# Patient Record
Sex: Male | Born: 1975 | Race: Black or African American | Hispanic: No | Marital: Married | Smoking: Never smoker
Health system: Southern US, Community
[De-identification: ages and names within clinical notes are randomized; demographics above are authoritative.]

## PROBLEM LIST (undated history)

## (undated) HISTORY — PX: HERNIA REPAIR: SHX51

---

## 1999-03-03 ENCOUNTER — Emergency Department (HOSPITAL_COMMUNITY): Admission: EM | Admit: 1999-03-03 | Discharge: 1999-03-03 | Payer: Self-pay | Admitting: Emergency Medicine

## 2014-04-02 ENCOUNTER — Encounter (HOSPITAL_COMMUNITY): Payer: Self-pay | Admitting: Emergency Medicine

## 2014-04-02 ENCOUNTER — Emergency Department (HOSPITAL_COMMUNITY): Payer: Worker's Compensation

## 2014-04-02 ENCOUNTER — Emergency Department (HOSPITAL_COMMUNITY)
Admission: EM | Admit: 2014-04-02 | Discharge: 2014-04-02 | Disposition: A | Discharge status: Home / Self Care | Payer: Worker's Compensation | Attending: Emergency Medicine | Admitting: Emergency Medicine

## 2014-04-02 DIAGNOSIS — Y9389 Activity, other specified: Secondary | ICD-10-CM | POA: Insufficient documentation

## 2014-04-02 DIAGNOSIS — Z880 Allergy status to penicillin: Secondary | ICD-10-CM | POA: Insufficient documentation

## 2014-04-02 DIAGNOSIS — S61409A Unspecified open wound of unspecified hand, initial encounter: Secondary | ICD-10-CM | POA: Diagnosis not present

## 2014-04-02 DIAGNOSIS — Y9289 Other specified places as the place of occurrence of the external cause: Secondary | ICD-10-CM | POA: Diagnosis not present

## 2014-04-02 DIAGNOSIS — W268XXA Contact with other sharp object(s), not elsewhere classified, initial encounter: Secondary | ICD-10-CM | POA: Diagnosis not present

## 2014-04-02 DIAGNOSIS — S61412A Laceration without foreign body of left hand, initial encounter: Secondary | ICD-10-CM

## 2014-04-02 MED ORDER — HYDROCODONE-ACETAMINOPHEN 5-325 MG PO TABS: 1.0000 | ORAL_TABLET | Freq: Four times a day (QID) | ORAL | Status: AC | PRN

## 2014-04-02 NOTE — ED Notes (Signed)
Pt was at work.  Polishing wine glass.  Stem broke lacerating left palm.

## 2014-04-02 NOTE — ED Provider Notes (Signed)
CSN: [id number]     Arrival date & time 04/02/14  1827 History  This chart was scribed for non-physician practitioner working with Audree CamelScott T Goldston, MD by Elveria Risingimelie Horne, ED Scribe. This patient was seen in room WTR5/WTR5 and the patient's care was started at 7:18 PM.   Chief Complaint  Patient presents with  . Extremity Laceration     The history is provided by the patient. No language interpreter was used.   HPI Comments: Collin Keller is a 38 y.o. male who presents to the Emergency Department with left palm laceration incurred tonight at work, approximately 1.5 hours ago. Patient reports that while polishing a wine glass, the stem snapped cutting his palm. Patient bandaged the laceration. Bleeding controlled prior to examination.  Patient reports updated Tetanus vaccination.  Patient is right hand dominant.    History reviewed. No pertinent past medical history. Past Surgical History  Procedure Laterality Date  . Hernia repair     History reviewed. No pertinent family history. History  Substance Use Topics  . Smoking status: Never Smoker   . Smokeless tobacco: Not on file  . Alcohol Use: No    Review of Systems  Constitutional: Negative for fever and chills.  Skin:       Laceration      Allergies  Amoxicillin and Penicillins  Home Medications   Prior to Admission medications   Not on File   Triage Vitals: BP 138/88  Pulse 99  Temp(Src) 98.5 F (36.9 C) (Oral)  Resp 18  SpO2 98% Physical Exam  Nursing note and vitals reviewed. Constitutional: He is oriented to person, place, and time. He appears well-developed and well-nourished. No distress.  HENT:  Head: Normocephalic and atraumatic.  Eyes: EOM are normal.  Neck: Neck supple.  Cardiovascular: Normal rate.   Pulmonary/Chest: Effort normal.  Musculoskeletal: Normal range of motion. He exhibits tenderness.  Left hand: 1cm laceration to palmar aspect at hypothenar  with no foreign object.  Mild  tenderness. Not actively bleeding.   Neurological: He is alert and oriented to person, place, and time.  Skin: Skin is warm and dry.  Psychiatric: He has a normal mood and affect. His behavior is normal.    ED Course  Procedures (including critical care time)  LACERATION REPAIR Performed by: Fayrene HelperBowie Crescent Gotham, PA-C Consent: Verbal consent obtained. Risks and benefits: risks, benefits and alternatives were discussed Patient identity confirmed: provided demographic data Time out performed prior to procedure Prepped and Draped in normal sterile fashion Wound explored Laceration Location: left hypothenar  Laceration Length: superficial 1 cm laceration, no foreign bodies  No Foreign Bodies seen or palpated Anesthesia: local infiltration Local anesthetic: lidocaine 2% with epinephrine Anesthetic total: 2 ml Irrigation method: syringe Amount of cleaning: standard Skin closure: Prolene 4-0 Number of sutures or staples: 5 Technique: simple interrupted  Patient tolerance: Patient tolerated the procedure well with no immediate complications.   COORDINATION OF CARE: 7:18 PM- Discussed treatment plan with patient at bedside and patient agreed to plan.   Labs Review Labs Reviewed - No data to display  Imaging Review Dg Hand Complete Left  04/02/2014   CLINICAL DATA:  Laceration  EXAM: LEFT HAND - COMPLETE 3+ VIEW  COMPARISON:  None.  FINDINGS: There is no evidence of fracture or dislocation. There is no evidence of arthropathy or other focal bone abnormality. Soft tissues are unremarkable.  IMPRESSION: Negative.   Electronically Signed   By: Natasha MeadLiviu  Pop M.D.   On: 04/02/2014 19:33  EKG Interpretation None      MDM   Final diagnoses:  Hand laceration, left, initial encounter    BP 138/88  Pulse 99  Temp(Src) 98.5 F (36.9 C) (Oral)  Resp 18  SpO2 98%   I have reviewed nursing notes and vital signs. I personally reviewed the imaging tests through PACS system  I reviewed  available ER/hospitalization records thought the EMR   I personally performed the services described in this documentation, which was scribed in my presence. The recorded information has been reviewed and is accurate.    Fayrene Helper, PA-C 04/02/14 1952

## 2014-04-02 NOTE — ED Provider Notes (Signed)
Medical screening examination/treatment/procedure(s) were performed by non-physician practitioner and as supervising physician I was immediately available for consultation/collaboration.   EKG Interpretation None        Mardy Hoppe T Eliyanna Ault, MD 04/02/14 2222 

## 2014-04-02 NOTE — Discharge Instructions (Signed)
Please keep wound clean.  Have your sutures remove in 7 days.  Take pain medication as needed.    Laceration Care, Adult A laceration is a cut or lesion that goes through all layers of the skin and into the tissue just beneath the skin. TREATMENT  Some lacerations may not require closure. Some lacerations may not be able to be closed due to an increased risk of infection. It is important to see your caregiver as soon as possible after an injury to minimize the risk of infection and maximize the opportunity for successful closure. If closure is appropriate, pain medicines may be given, if needed. The wound will be cleaned to help prevent infection. Your caregiver will use stitches (sutures), staples, wound glue (adhesive), or skin adhesive strips to repair the laceration. These tools bring the skin edges together to allow for faster healing and a better cosmetic outcome. However, all wounds will heal with a scar. Once the wound has healed, scarring can be minimized by covering the wound with sunscreen during the day for 1 full year. HOME CARE INSTRUCTIONS  For sutures or staples:  Keep the wound clean and dry.  If you were given a bandage (dressing), you should change it at least once a day. Also, change the dressing if it becomes wet or dirty, or as directed by your caregiver.  Wash the wound with soap and water 2 times a day. Rinse the wound off with water to remove all soap. Pat the wound dry with a clean towel.  After cleaning, apply a thin layer of the antibiotic ointment as recommended by your caregiver. This will help prevent infection and keep the dressing from sticking.  You may shower as usual after the first 24 hours. Do not soak the wound in water until the sutures are removed.  Only take over-the-counter or prescription medicines for pain, discomfort, or fever as directed by your caregiver.  Get your sutures or staples removed as directed by your caregiver. For skin adhesive  strips:  Keep the wound clean and dry.  Do not get the skin adhesive strips wet. You may bathe carefully, using caution to keep the wound dry.  If the wound gets wet, pat it dry with a clean towel.  Skin adhesive strips will fall off on their own. You may trim the strips as the wound heals. Do not remove skin adhesive strips that are still stuck to the wound. They will fall off in time. For wound adhesive:  You may briefly wet your wound in the shower or bath. Do not soak or scrub the wound. Do not swim. Avoid periods of heavy perspiration until the skin adhesive has fallen off on its own. After showering or bathing, gently pat the wound dry with a clean towel.  Do not apply liquid medicine, cream medicine, or ointment medicine to your wound while the skin adhesive is in place. This may loosen the film before your wound is healed.  If a dressing is placed over the wound, be careful not to apply tape directly over the skin adhesive. This may cause the adhesive to be pulled off before the wound is healed.  Avoid prolonged exposure to sunlight or tanning lamps while the skin adhesive is in place. Exposure to ultraviolet light in the first year will darken the scar.  The skin adhesive will usually remain in place for 5 to 10 days, then naturally fall off the skin. Do not pick at the adhesive film. You may need a  tetanus shot if:  You cannot remember when you had your last tetanus shot.  You have never had a tetanus shot. If you get a tetanus shot, your arm may swell, get red, and feel warm to the touch. This is common and not a problem. If you need a tetanus shot and you choose not to have one, there is a rare chance of getting tetanus. Sickness from tetanus can be serious. SEEK MEDICAL CARE IF:   You have redness, swelling, or increasing pain in the wound.  You see a red line that goes away from the wound.  You have yellowish-white fluid (pus) coming from the wound.  You have a  fever.  You notice a bad smell coming from the wound or dressing.  Your wound breaks open before or after sutures have been removed.  You notice something coming out of the wound such as wood or glass.  Your wound is on your hand or foot and you cannot move a finger or toe. SEEK IMMEDIATE MEDICAL CARE IF:   Your pain is not controlled with prescribed medicine.  You have severe swelling around the wound causing pain and numbness or a change in color in your arm, hand, leg, or foot.  Your wound splits open and starts bleeding.  You have worsening numbness, weakness, or loss of function of any joint around or beyond the wound.  You develop painful lumps near the wound or on the skin anywhere on your body. MAKE SURE YOU:   Understand these instructions.  Will watch your condition.  Will get help right away if you are not doing well or get worse. Document Released: 08/15/2005 Document Revised: 11/07/2011 Document Reviewed: 02/08/2011 [facility] Patient Information 2015 [place], [place]. This information is not intended to replace advice given to you by your health care provider. Make sure you discuss any questions you have with your health care provider.

## 2014-04-02 NOTE — ED Notes (Signed)
Hand dressed with bacitracin and dry dressing. Knows to have sutures removed in 7 days. No other questions/concerns.

## 2014-04-09 ENCOUNTER — Emergency Department (HOSPITAL_COMMUNITY)
Admission: EM | Admit: 2014-04-09 | Discharge: 2014-04-09 | Disposition: A | Discharge status: Home / Self Care | Payer: Worker's Compensation | Source: Home / Self Care

## 2015-12-31 ENCOUNTER — Emergency Department (HOSPITAL_COMMUNITY)
Admission: EM | Admit: 2015-12-31 | Discharge: 2015-12-31 | Disposition: A | Discharge status: Home / Self Care | Payer: 59 | Attending: Emergency Medicine | Admitting: Emergency Medicine

## 2015-12-31 ENCOUNTER — Emergency Department (HOSPITAL_COMMUNITY): Payer: 59

## 2015-12-31 ENCOUNTER — Encounter (HOSPITAL_COMMUNITY): Payer: Self-pay | Admitting: Emergency Medicine

## 2015-12-31 DIAGNOSIS — R109 Unspecified abdominal pain: Secondary | ICD-10-CM

## 2015-12-31 DIAGNOSIS — Z880 Allergy status to penicillin: Secondary | ICD-10-CM | POA: Diagnosis not present

## 2015-12-31 DIAGNOSIS — R1013 Epigastric pain: Secondary | ICD-10-CM | POA: Diagnosis not present

## 2015-12-31 LAB — COMPREHENSIVE METABOLIC PANEL
ALT: 54 U/L (ref 17–63)
AST: 42 U/L — AB (ref 15–41)
Albumin: 4 g/dL (ref 3.5–5.0)
Alkaline Phosphatase: 84 U/L (ref 38–126)
Anion gap: 9 (ref 5–15)
BILIRUBIN TOTAL: 0.5 mg/dL (ref 0.3–1.2)
BUN: 10 mg/dL (ref 6–20)
CO2: 26 mmol/L (ref 22–32)
Calcium: 8.9 mg/dL (ref 8.9–10.3)
Chloride: 103 mmol/L (ref 101–111)
Creatinine, Ser: 1.23 mg/dL (ref 0.61–1.24)
GFR calc non Af Amer: >60 mL/min (ref >60)
Glucose, Bld: 100 mg/dL — ABNORMAL HIGH (ref 65–99)
POTASSIUM: 4.5 mmol/L (ref 3.5–5.1)
Sodium: 138 mmol/L (ref 135–145)
TOTAL PROTEIN: 7.6 g/dL (ref 6.5–8.1)

## 2015-12-31 LAB — URINE MICROSCOPIC-ADD ON: Bacteria, UA: NONE SEEN

## 2015-12-31 LAB — URINALYSIS, ROUTINE W REFLEX MICROSCOPIC
Bilirubin Urine: NEGATIVE
Glucose, UA: NEGATIVE mg/dL
Ketones, ur: NEGATIVE mg/dL
LEUKOCYTES UA: NEGATIVE
NITRITE: NEGATIVE
PH: 7 (ref 5.0–8.0)
PROTEIN: NEGATIVE mg/dL
Specific Gravity, Urine: 1.021 (ref 1.005–1.030)

## 2015-12-31 LAB — CBC
HCT: 45.2 % (ref 39.0–52.0)
Hemoglobin: 14.5 g/dL (ref 13.0–17.0)
MCH: 26.5 pg (ref 26.0–34.0)
MCHC: 32.1 g/dL (ref 30.0–36.0)
MCV: 82.5 fL (ref 78.0–100.0)
PLATELETS: 191 10*3/uL (ref 150–400)
RBC: 5.48 MIL/uL (ref 4.22–5.81)
RDW: 12.6 % (ref 11.5–15.5)
WBC: 4.2 10*3/uL (ref 4.0–10.5)

## 2015-12-31 LAB — LIPASE, BLOOD: LIPASE: 38 U/L (ref 11–51)

## 2015-12-31 MED ORDER — DICYCLOMINE HCL 20 MG PO TABS
20.0000 mg | ORAL_TABLET | Freq: Three times a day (TID) | ORAL | Status: AC | PRN
Start: 2015-12-31 — End: ?

## 2015-12-31 MED ORDER — IOPAMIDOL (ISOVUE-300) INJECTION 61%
INTRAVENOUS | Status: AC
Start: 2015-12-31 — End: 2015-12-31
  Administered 2015-12-31: 100 mL
  Filled 2015-12-31: qty 100

## 2015-12-31 MED ORDER — OMEPRAZOLE 20 MG PO CPDR: 20.0000 mg | DELAYED_RELEASE_CAPSULE | Freq: Every day | ORAL | Status: AC

## 2015-12-31 MED ORDER — SODIUM CHLORIDE 0.9 % IV SOLN
INTRAVENOUS | Status: DC
  Administered 2015-12-31: 12:00:00 via INTRAVENOUS

## 2015-12-31 MED ORDER — PROMETHAZINE HCL 25 MG PO TABS: 25.0000 mg | ORAL_TABLET | Freq: Four times a day (QID) | ORAL | Status: AC | PRN

## 2015-12-31 MED ORDER — SODIUM CHLORIDE 0.9 % IV BOLUS (SEPSIS)
1000.0000 mL | Freq: Once | INTRAVENOUS | Status: AC
  Administered 2015-12-31: 1000 mL via INTRAVENOUS

## 2015-12-31 MED ORDER — ONDANSETRON HCL 4 MG/2ML IJ SOLN
4.0000 mg | Freq: Once | INTRAMUSCULAR | Status: AC
  Administered 2015-12-31: 4 mg via INTRAVENOUS
  Filled 2015-12-31: qty 2

## 2015-12-31 MED ORDER — HYDROMORPHONE HCL 1 MG/ML IJ SOLN
0.5000 mg | INTRAMUSCULAR | Status: DC | PRN
  Administered 2015-12-31: 0.5 mg via INTRAVENOUS
  Filled 2015-12-31: qty 1

## 2015-12-31 NOTE — Discharge Instructions (Signed)

## 2015-12-31 NOTE — ED Provider Notes (Signed)
CSN: [id number]     Arrival date & time 12/31/15  1018 History   First MD Initiated Contact with Patient 12/31/15 1043     Chief Complaint  Patient presents with  . Abdominal Pain   HPI Patient presents to the emergency room with complaints of upper abdominal pain that started about 3 days ago. Her symptoms were intermittent and milder in severity. He's had increasing episodes over the last couple of days. Pain is located primarily in the upper abdomen epigastric region and a little bit lower but above the belly button. He also feels bloated. He tried taking a laxative but that has not helped. He's had some decreased caliber to his stools. He denies any vomiting. He denies any fever. He denies any weight loss. History reviewed. No pertinent past medical history. Past Surgical History  Procedure Laterality Date  . Hernia repair     No family history on file. Social History  Substance Use Topics  . Smoking status: Never Smoker   . Smokeless tobacco: None  . Alcohol Use: No    Review of Systems  All other systems reviewed and are negative.     Allergies  Shellfish allergy; Amoxicillin; and Penicillins  Home Medications   Prior to Admission medications   Medication Sig Start Date End Date Taking? Authorizing Provider  dicyclomine (BENTYL) 20 MG tablet Take 1 tablet (20 mg total) by mouth 3 (three) times daily as needed for spasms. 12/31/15   Linwood Dibbles, MD  HYDROcodone-acetaminophen (NORCO/VICODIN) 5-325 MG per tablet Take 1 tablet by mouth every 6 (six) hours as needed for moderate pain or severe pain. 04/02/14   Fayrene Helper, PA-C  omeprazole (PRILOSEC) 20 MG capsule Take 1 capsule (20 mg total) by mouth daily. 12/31/15   Linwood Dibbles, MD  promethazine (PHENERGAN) 25 MG tablet Take 1 tablet (25 mg total) by mouth every 6 (six) hours as needed for nausea or vomiting. 12/31/15   Linwood Dibbles, MD   BP 116/80 mmHg  Pulse 78  Temp(Src) 98.4 F (36.9 C) (Oral)  Resp 14  SpO2 100% Physical Exam   Constitutional: He appears well-developed and well-nourished. No distress.  HENT:  Head: Normocephalic and atraumatic.  Right Ear: External ear normal.  Left Ear: External ear normal.  Eyes: Conjunctivae are normal. Right eye exhibits no discharge. Left eye exhibits no discharge. No scleral icterus.  Neck: Neck supple. No tracheal deviation present.  Cardiovascular: Normal rate, regular rhythm and intact distal pulses.   Pulmonary/Chest: Effort normal and breath sounds normal. No stridor. No respiratory distress. He has no wheezes. He has no rales.  Abdominal: Soft. Bowel sounds are normal. He exhibits no distension. There is tenderness in the epigastric area. There is guarding. There is no rigidity and no rebound. No hernia.  Musculoskeletal: He exhibits no edema or tenderness.  Neurological: He is alert. He has normal strength. No cranial nerve deficit (no facial droop, extraocular movements intact, no slurred speech) or sensory deficit. He exhibits normal muscle tone. He displays no seizure activity. Coordination normal.  Skin: Skin is warm and dry. No rash noted.  Psychiatric: He has a normal mood and affect.  Nursing note and vitals reviewed.   ED Course  Procedures (including critical care time) Labs Review Labs Reviewed  COMPREHENSIVE METABOLIC PANEL - Abnormal; Notable for the following:    Glucose, Bld 100 (*)    AST 42 (*)    All other components within normal limits  URINALYSIS, ROUTINE W REFLEX MICROSCOPIC (NOT AT  [facility]) - Abnormal; Notable for the following:    Hgb urine dipstick TRACE (*)    All other components within normal limits  URINE MICROSCOPIC-ADD ON - Abnormal; Notable for the following:    Squamous Epithelial / LPF 0-5 (*)    All other components within normal limits  LIPASE, BLOOD  CBC    Imaging Review Ct Abdomen Pelvis W Contrast  12/31/2015  CLINICAL DATA:  Abdominal pain for 3 days, history of hernia repair EXAM: CT ABDOMEN AND PELVIS WITH CONTRAST  TECHNIQUE: Multidetector CT imaging of the abdomen and pelvis was performed using the standard protocol following bolus administration of intravenous contrast. CONTRAST:  1 ISOVUE-300 IOPAMIDOL (ISOVUE-300) INJECTION 61% COMPARISON:  None. FINDINGS: Lower chest:  The lung bases are unremarkable. Hepatobiliary: Enhanced liver shows no focal mass. No biliary ductal dilatation. No calcified gallstones are noted within gallbladder. Pancreas: Enhanced pancreas is unremarkable. Spleen: Enhanced spleen is unremarkable. Adrenals/Urinary Tract: No adrenal gland mass. Enhanced kidneys are symmetrical in size. No hydronephrosis or hydroureter. No nephrolithiasis. No calcified calculi are noted within urinary bladder. Stomach/Bowel: There is no gastric outlet obstruction. Mild gaseous distended small bowel loops are noted in mid and lower abdomen. Some fluid is noted in distal small bowel loops. Nonspecific mild enteritis cannot be excluded. There is no evidence of transition point in caliber of small bowel wall. No thickening of small bowel wall. The terminal ileum is unremarkable. No pericecal inflammation. Normal appendix clearly visualize in axial image 56. Normal appendix is confirmed in sagittal image 63. Vascular/Lymphatic: No aortic aneurysm. No retroperitoneal or mesenteric adenopathy. Small nonspecific lymph nodes are noted in right lower quadrant mesentery. Reproductive: Prostate gland and seminal vesicles are unremarkable. Other: There is no ascites or free air. Tiny umbilical hernia containing fat without evidence of acute complication. There is no inguinal adenopathy. Musculoskeletal: Minimal degenerative changes lower thoracic spine. The lumbar spine is unremarkable. No destructive bony lesions are noted. No destructive bony lesions are noted within pelvis. IMPRESSION: 1. Nonspecific mild fluid and gaseous distended small bowel loops in mid and lower abdomen. Nonspecific mild enteritis cannot be excluded. There  is no transition point in caliber of small bowel. No evidence of small bowel obstruction. 2. No ascites or free air. 3. No pericecal inflammation.  Normal appendix. 4. No hydronephrosis or hydroureter. Electronically Signed   By: Natasha MeadLiviu  Pop M.D.   On: 12/31/2015 14:47   I have personally reviewed and evaluated these images and lab results as part of my medical decision-making.    MDM   Final diagnoses:  Abdominal pain, unspecified abdominal location    Labs and CT scan are reassuring.  NO acute obstruction , mass or surgical process.  Unclear etiology but stable for outpatient follow up .  Will dc with bentyl and phenergan.  Refill his prilosec    Linwood DibblesJon Beniah Magnan, MD 12/31/15 1524

## 2015-12-31 NOTE — ED Notes (Signed)
Pt reports upper abd pain and nausea for the last 3 days. Pt also reports feeling bloated and unable to have a "normal bm" Pt reports ribbon like stool.

## 2021-04-30 ENCOUNTER — Other Ambulatory Visit: Payer: Self-pay | Admitting: Sports Medicine

## 2021-04-30 ENCOUNTER — Ambulatory Visit
Admission: RE | Admit: 2021-04-30 | Discharge: 2021-04-30 | Disposition: A | Discharge status: Home / Self Care | Payer: 59 | Source: Ambulatory Visit | Attending: Sports Medicine | Admitting: Sports Medicine

## 2021-04-30 DIAGNOSIS — M25521 Pain in right elbow: Secondary | ICD-10-CM

## 2022-06-02 IMAGING — CR DG ELBOW COMPLETE 3+V*R*
4 series · 4 of 4 positions shown · non-contrast
Comparison: None.

CLINICAL DATA: Right elbow pain

EXAM:
RIGHT ELBOW - COMPLETE 3+ VIEW

[x elbow joint ap right]
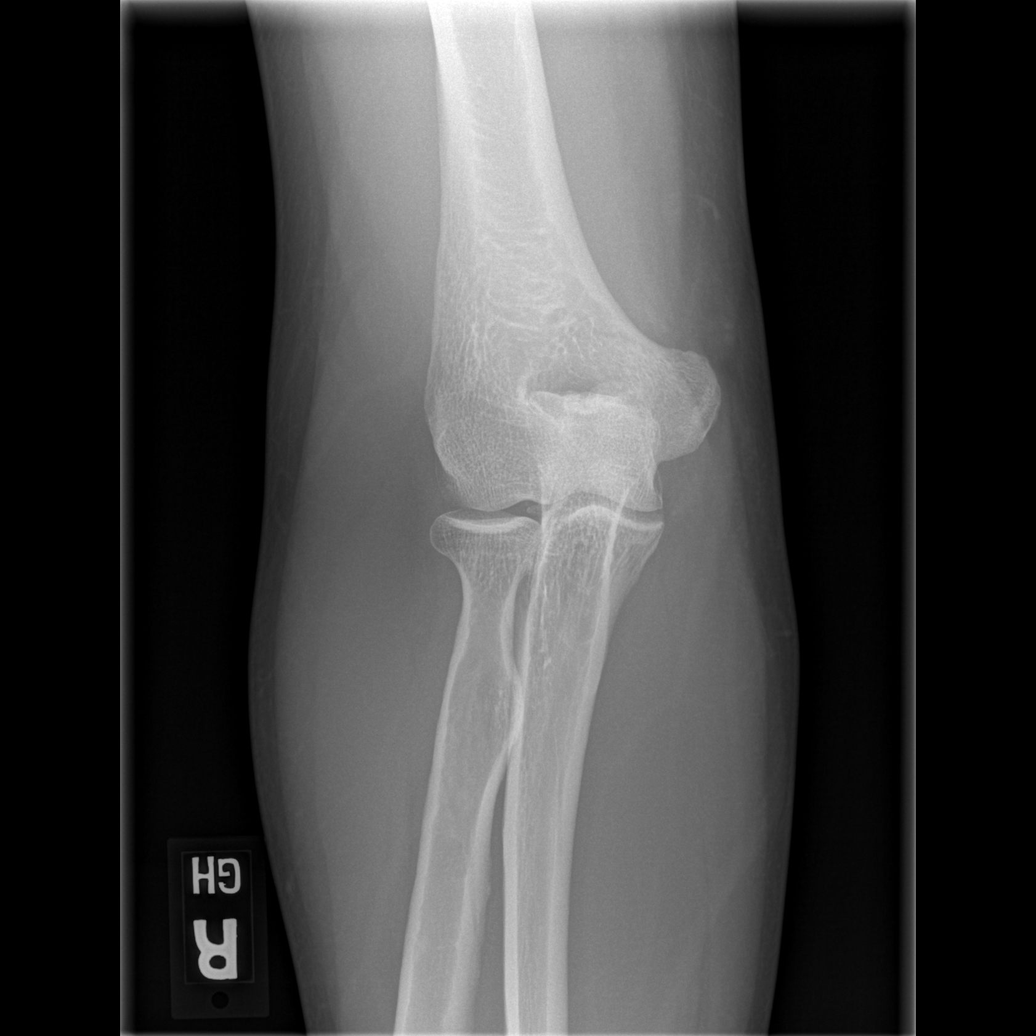

[x elbow joint obl. right (1 of 2)]
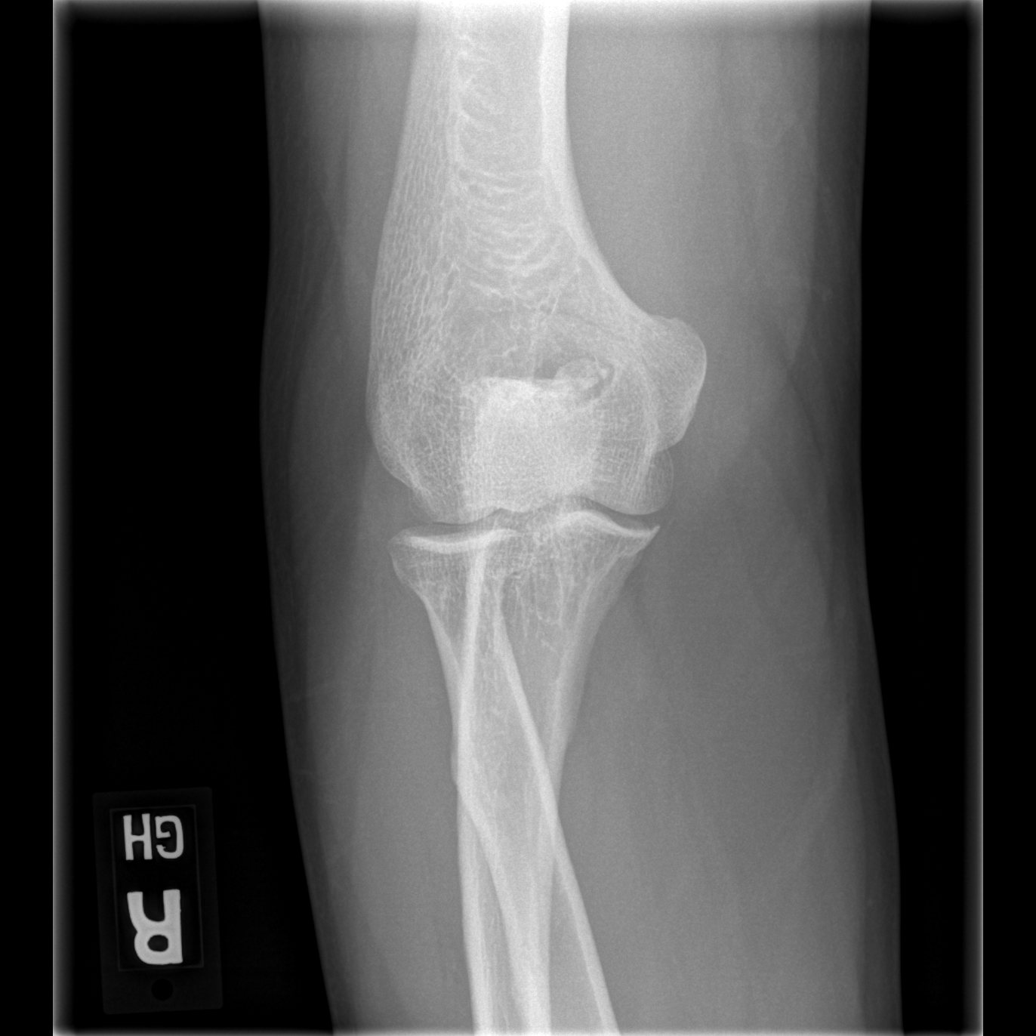

[x elbow joint obl. right (2 of 2)]
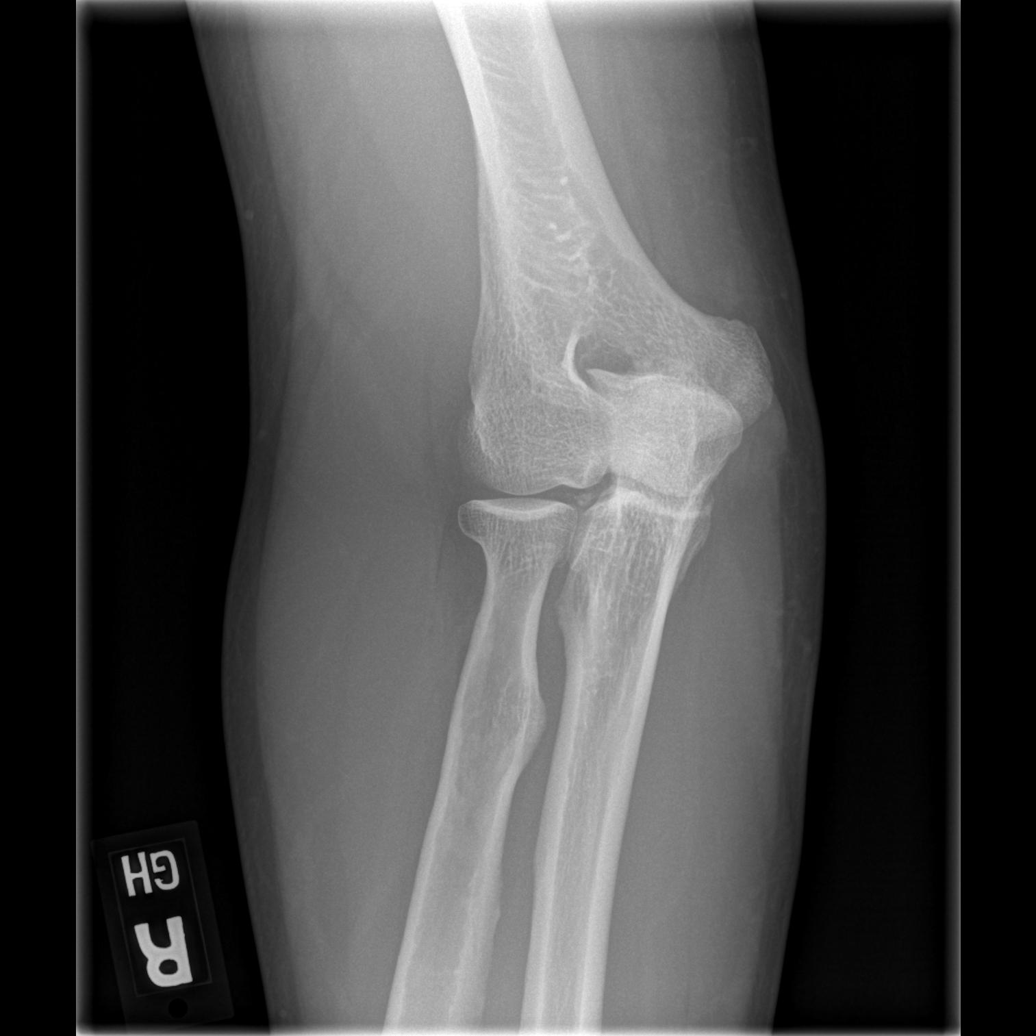

[x elbow joint lat right]
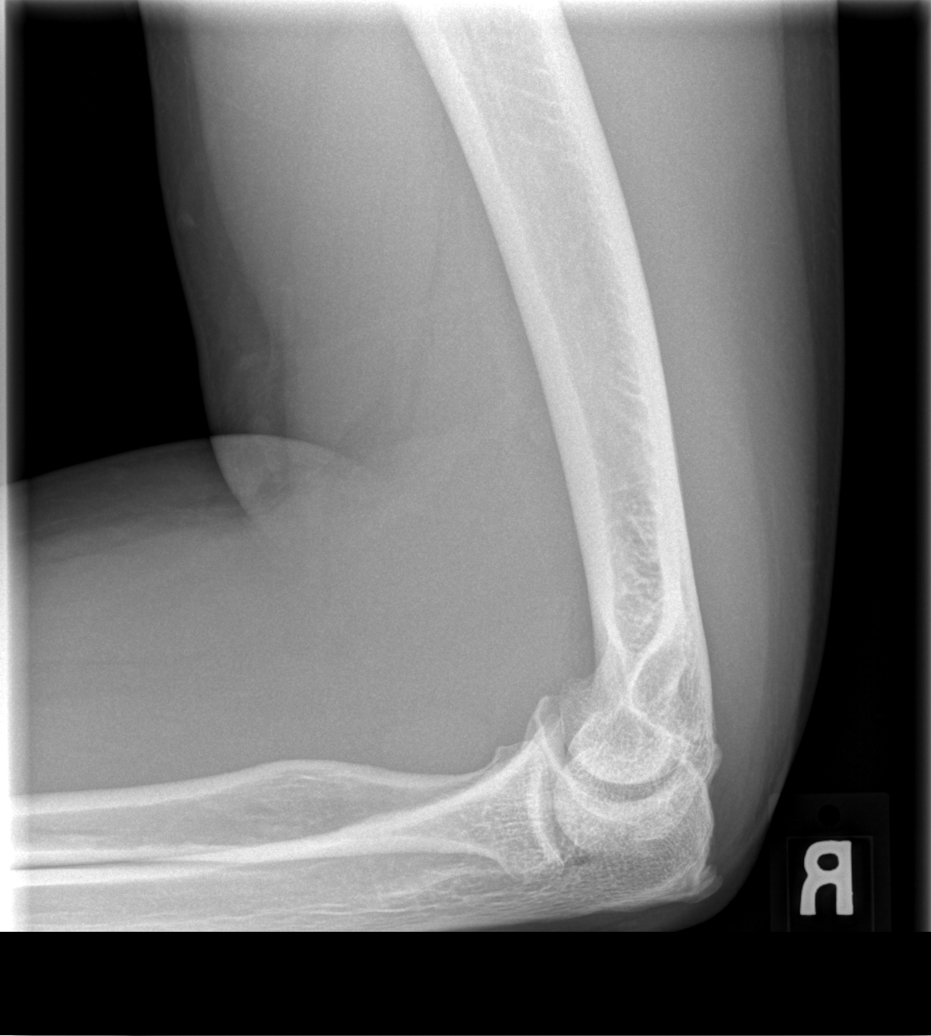

[4 of 4 positions shown; findings below may reference images not displayed]

FINDINGS: There is no evidence of fracture, dislocation, or joint effusion.
There is no evidence of arthropathy or other focal bone abnormality.
Soft tissues are unremarkable.
IMPRESSION: Negative.

## 2023-10-16 ENCOUNTER — Ambulatory Visit (INDEPENDENT_AMBULATORY_CARE_PROVIDER_SITE_OTHER): Payer: 59 | Admitting: Podiatry

## 2023-10-16 DIAGNOSIS — Z79899 Other long term (current) drug therapy: Secondary | ICD-10-CM

## 2023-10-16 DIAGNOSIS — B351 Tinea unguium: Secondary | ICD-10-CM | POA: Diagnosis not present

## 2023-10-16 NOTE — Patient Instructions (Signed)

## 2023-10-16 NOTE — Progress Notes (Signed)
 Subjective:   Patient ID: Collin Keller, male   DOB: 48 y.o.   MRN: [id number]   HPI Chief Complaint  Patient presents with   Nail Problem    RM#11 Left foot big toe patient states not connected to nail bed possible fungus.   48 year old male presents the office with above concerns.  He has been ongoing for some time but denies any recent treatment.  No pain in the nails.  No swelling or redness or drainage or signs of infection.  Review of Systems  All other systems reviewed and are negative.       Objective:  Physical Exam  General: AAO x3, NAD  Dermatological: Bilateral hallux nails are hypertrophic, dystrophic with yellow, brown discoloration mostly on the medial portion of the nails.  There is no hyperpigmentation in the surrounding skin.  No open lesions.  Vascular: Dorsalis Pedis artery and Posterior Tibial artery pedal pulses are 2/4 bilateral with immedate capillary fill time. There is no pain with calf compression, swelling, warmth, erythema.   Neruologic: Grossly intact via light touch bilateral.    Musculoskeletal: No gross boney pedal deformities bilateral. No pain, crepitus, or limitation noted with foot and ankle range of motion bilateral. Muscular strength 5/5 in all groups tested bilateral.  Gait: Unassisted, Nonantalgic.       Assessment:   Onychomycosis     Plan:  -Treatment options discussed including all alternatives, risks, and complications -Etiology of symptoms were discussed -We discussed oral, topical as well as alternative treatments for nail fungus.  He will proceed with oral Lamisil.  Discussed side effects, success rates.  Will order CBC, LFT prior to starting medication.  Return in about 3 months (around 01/13/2024).  Vivi Barrack DPM

## 2023-10-17 ENCOUNTER — Other Ambulatory Visit: Payer: Self-pay | Admitting: Podiatry

## 2023-10-17 DIAGNOSIS — Z79899 Other long term (current) drug therapy: Secondary | ICD-10-CM

## 2023-10-17 LAB — CBC WITH DIFFERENTIAL/PLATELET
Absolute Lymphocytes: 2475 {cells}/uL (ref 850–3900)
Absolute Monocytes: 368 {cells}/uL (ref 200–950)
Basophils Absolute: 39 {cells}/uL (ref 0–200)
Basophils Relative: 0.8 %
Eosinophils Absolute: 108 {cells}/uL (ref 15–500)
Eosinophils Relative: 2.2 %
HCT: 46.7 % (ref 38.5–50.0)
Hemoglobin: 15.2 g/dL (ref 13.2–17.1)
MCH: 26.3 pg — ABNORMAL LOW (ref 27.0–33.0)
MCHC: 32.5 g/dL (ref 32.0–36.0)
MCV: 80.9 fL (ref 80.0–100.0)
MPV: 10.6 fL (ref 7.5–12.5)
Monocytes Relative: 7.5 %
Neutro Abs: 1911 {cells}/uL (ref 1500–7800)
Neutrophils Relative %: 39 %
Platelets: 224 10*3/uL (ref 140–400)
RBC: 5.77 10*6/uL (ref 4.20–5.80)
RDW: 12.7 % (ref 11.0–15.0)
Total Lymphocyte: 50.5 %
WBC: 4.9 10*3/uL (ref 3.8–10.8)

## 2023-10-17 LAB — HEPATIC FUNCTION PANEL
AG Ratio: 1.6 (calc) (ref 1.0–2.5)
ALT: 30 U/L (ref 9–46)
AST: 22 U/L (ref 10–40)
Albumin: 4.5 g/dL (ref 3.6–5.1)
Alkaline phosphatase (APISO): 79 U/L (ref 36–130)
Bilirubin, Direct: 0.1 mg/dL (ref 0.0–0.2)
Globulin: 2.8 g/dL (ref 1.9–3.7)
Indirect Bilirubin: 0.4 mg/dL (ref 0.2–1.2)
Total Bilirubin: 0.5 mg/dL (ref 0.2–1.2)
Total Protein: 7.3 g/dL (ref 6.1–8.1)

## 2023-10-17 MED ORDER — TERBINAFINE HCL 250 MG PO TABS: 250.0000 mg | ORAL_TABLET | Freq: Every day | ORAL | 0 refills | Status: DC

## 2023-10-18 ENCOUNTER — Telehealth: Payer: Self-pay | Admitting: Podiatry

## 2023-10-18 NOTE — Telephone Encounter (Signed)
 Pt called and had labs done earlier this week and once results were back pt was to have a medication sent in and he has not heard anything.  I seen some of the results are still pending and explained that we are closing early today and that Dr Ardelle Anton is in surgery so he may not get an answer today.

## 2023-10-19 ENCOUNTER — Telehealth: Payer: Self-pay

## 2023-10-19 NOTE — Telephone Encounter (Signed)
 PA request for Terbinafine HCL 250 mg tablet received from [facility]. PA submitted through covermymeds.

## 2023-10-20 NOTE — Telephone Encounter (Signed)
 PA was denied

## 2024-01-15 ENCOUNTER — Ambulatory Visit: Payer: 59 | Admitting: Podiatry

## 2024-01-18 ENCOUNTER — Encounter: Payer: Self-pay | Admitting: Podiatry

## 2024-01-18 ENCOUNTER — Ambulatory Visit (INDEPENDENT_AMBULATORY_CARE_PROVIDER_SITE_OTHER): Admitting: Podiatry

## 2024-01-18 DIAGNOSIS — Z79899 Other long term (current) drug therapy: Secondary | ICD-10-CM

## 2024-01-18 DIAGNOSIS — B351 Tinea unguium: Secondary | ICD-10-CM

## 2024-01-18 DIAGNOSIS — L600 Ingrowing nail: Secondary | ICD-10-CM | POA: Diagnosis not present

## 2024-01-18 NOTE — Progress Notes (Unsigned)
 Subjective: Chief Complaint  Patient presents with   Nail Problem    Nail f/u Lamisil . R1 lateral border was tender and given an antibiotic 1 week ago. Non diabetic.   48 year old male presents the office today for the above concerns.  States that he has been taking Lamisil  without any side effect issues.  Testing the pair started recent clearing of the proximal nail folds posterior.  Also recently started getting an ingrown toenail or infection on the right hallux toenail.  He was started on antibiotics and this did resolve the issue.  Currently no pain or any drainage.  No injuries.   Objective: AAO x3, NAD DP/PT pulses palpable bilaterally, CRT less than 3 seconds There is some clearing of the proximal nail folds however nails continue to be growing out.  Medial nail still hypertrophic and dystrophic with discoloration.  There is no significant incurvation present particular the right hallux toenail there is no signs of infection today.  There is no drainage or pus. No pain with calf compression, swelling, warmth, erythema  Assessment: Onychomycosis, ingrown toenail (resolved)  Plan: -All treatment options discussed with the patient including all alternatives, risks, complications.  -Or nightly continue Lamisil  for an additional 30 days.  Will recheck CBC, LFT prior to starting the medication. -At this time there is no signs of infection of the toenail.  Discussed that this becomes a reoccurring issue partial nail avulsion. -Patient encouraged to call the office with any questions, concerns, change in symptoms.   Charity Conch DPM

## 2024-01-18 NOTE — Patient Instructions (Signed)

## 2024-01-19 ENCOUNTER — Ambulatory Visit: Payer: Self-pay | Admitting: Podiatry

## 2024-01-19 ENCOUNTER — Other Ambulatory Visit: Payer: Self-pay | Admitting: Podiatry

## 2024-01-19 LAB — CBC WITH DIFFERENTIAL/PLATELET
Absolute Lymphocytes: 2134 {cells}/uL (ref 850–3900)
Absolute Monocytes: 306 {cells}/uL (ref 200–950)
Basophils Absolute: 38 {cells}/uL (ref 0–200)
Basophils Relative: 0.8 %
Eosinophils Absolute: 108 {cells}/uL (ref 15–500)
Eosinophils Relative: 2.3 %
HCT: 46 % (ref 38.5–50.0)
Hemoglobin: 15 g/dL (ref 13.2–17.1)
MCH: 26.9 pg — ABNORMAL LOW (ref 27.0–33.0)
MCHC: 32.6 g/dL (ref 32.0–36.0)
MCV: 82.4 fL (ref 80.0–100.0)
MPV: 10.4 fL (ref 7.5–12.5)
Monocytes Relative: 6.5 %
Neutro Abs: 2115 {cells}/uL (ref 1500–7800)
Neutrophils Relative %: 45 %
Platelets: 227 10*3/uL (ref 140–400)
RBC: 5.58 10*6/uL (ref 4.20–5.80)
RDW: 12.8 % (ref 11.0–15.0)
Total Lymphocyte: 45.4 %
WBC: 4.7 10*3/uL (ref 3.8–10.8)

## 2024-01-19 LAB — HEPATIC FUNCTION PANEL
AG Ratio: 1.5 (calc) (ref 1.0–2.5)
ALT: 29 U/L (ref 9–46)
AST: 26 U/L (ref 10–40)
Albumin: 4.5 g/dL (ref 3.6–5.1)
Alkaline phosphatase (APISO): 77 U/L (ref 36–130)
Bilirubin, Direct: 0.1 mg/dL (ref 0.0–0.2)
Globulin: 3 g/dL (ref 1.9–3.7)
Indirect Bilirubin: 0.5 mg/dL (ref 0.2–1.2)
Total Bilirubin: 0.6 mg/dL (ref 0.2–1.2)
Total Protein: 7.5 g/dL (ref 6.1–8.1)

## 2024-01-19 MED ORDER — TERBINAFINE HCL 250 MG PO TABS: 250.0000 mg | ORAL_TABLET | Freq: Every day | ORAL | 0 refills | Status: AC

## 2024-01-23 ENCOUNTER — Telehealth: Payer: Self-pay

## 2024-01-23 ENCOUNTER — Other Ambulatory Visit: Payer: Self-pay | Admitting: Podiatry

## 2024-01-23 NOTE — Telephone Encounter (Addendum)
 PA request received from [facility] for Terbinafine  HCl 250 mg tablet. PA submitted through covermymeds and waiting on response.  TANNAR BROKER  (Key: [id number]) PA Case ID #: [id number] Rx #: [id number]

## 2024-01-23 NOTE — Telephone Encounter (Signed)
 PA request approved 01/23/2024-04/22/2024

## 2024-01-24 ENCOUNTER — Telehealth: Payer: Self-pay | Admitting: Podiatry

## 2024-01-24 NOTE — Telephone Encounter (Signed)
 Patients pharmacy told him the terbinafine  script was denied by the prescriber.Preferred pharmacy [facility] [phone]
